# Patient Record
Sex: Female | Born: 1980 | Race: White | Hispanic: No | Marital: Married | Smoking: Never smoker
Health system: Southern US, Community
[De-identification: ages and names within clinical notes are randomized; demographics above are authoritative.]

---
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
## 2004-05-15 ENCOUNTER — Observation Stay: Payer: Self-pay | Admitting: Obstetrics and Gynecology

## 2004-05-27 ENCOUNTER — Inpatient Hospital Stay: Payer: Self-pay

## 2005-07-23 HISTORY — PX: BACK SURGERY: SHX140

## 2006-10-21 ENCOUNTER — Observation Stay: Payer: Self-pay | Admitting: Obstetrics and Gynecology

## 2006-10-27 ENCOUNTER — Inpatient Hospital Stay: Payer: Self-pay

## 2007-06-17 ENCOUNTER — Ambulatory Visit: Payer: Self-pay

## 2008-07-12 ENCOUNTER — Ambulatory Visit: Payer: Self-pay

## 2008-07-23 HISTORY — PX: GALLBLADDER SURGERY: SHX652

## 2008-12-03 ENCOUNTER — Inpatient Hospital Stay: Payer: Self-pay | Admitting: Certified Nurse Midwife

## 2010-04-26 IMAGING — US ABDOMEN ULTRASOUND
1 series · 17 of 25 positions shown · non-contrast
Comparison: none

REASON FOR EXAM: 16 weeks OB pt   epigastric pain   CALL report   [id number]
COMMENTS:

[Series 1: abdomen ultrasound · 17 of 49 slices shown]
[im 1/49]
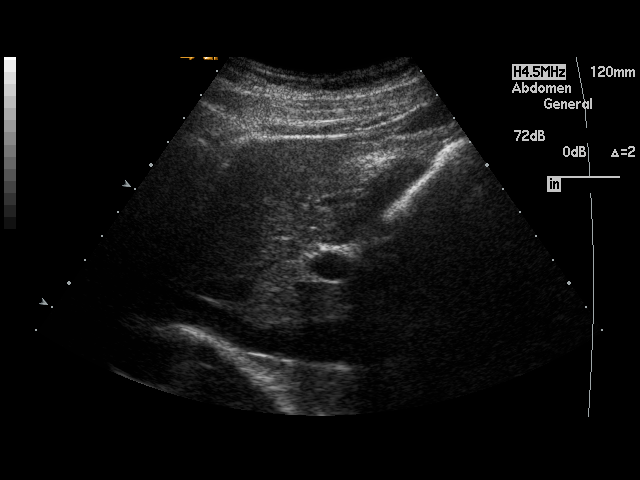
[im 5/49]
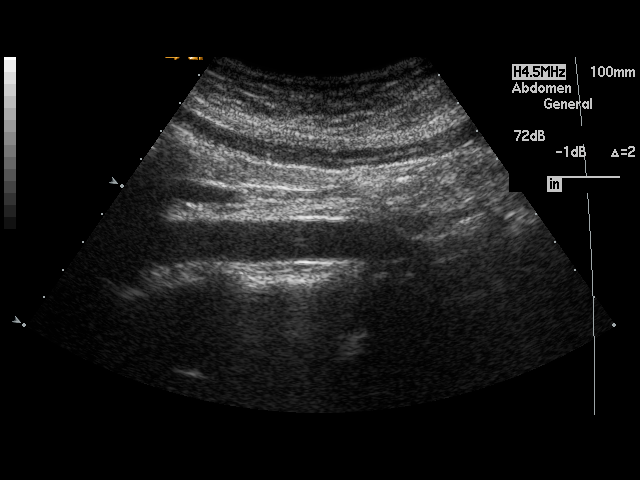
[im 7/49]
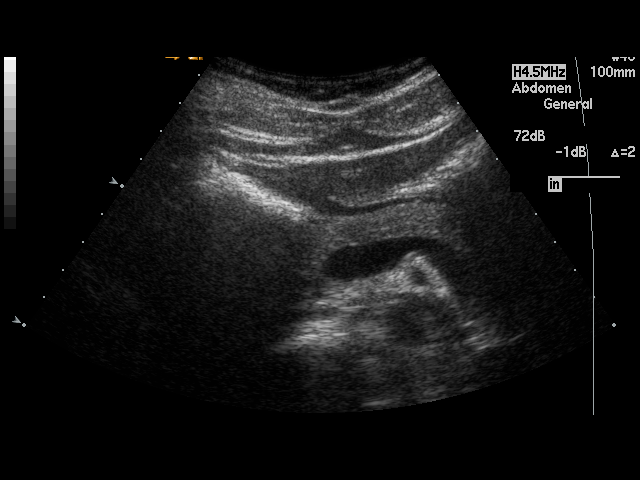
[im 11/49]
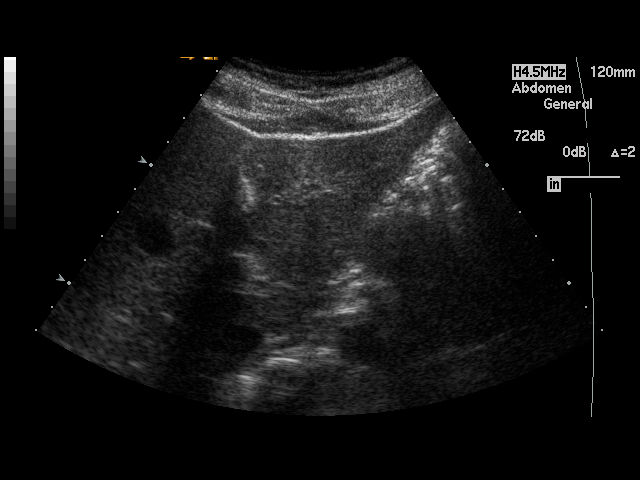
[im 13/49]
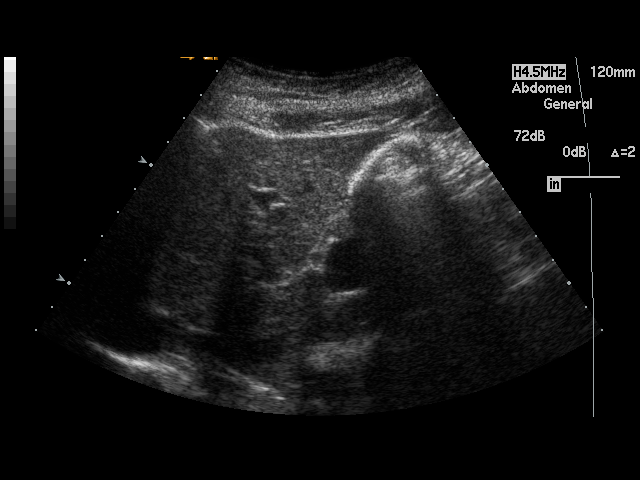
[im 17/49]
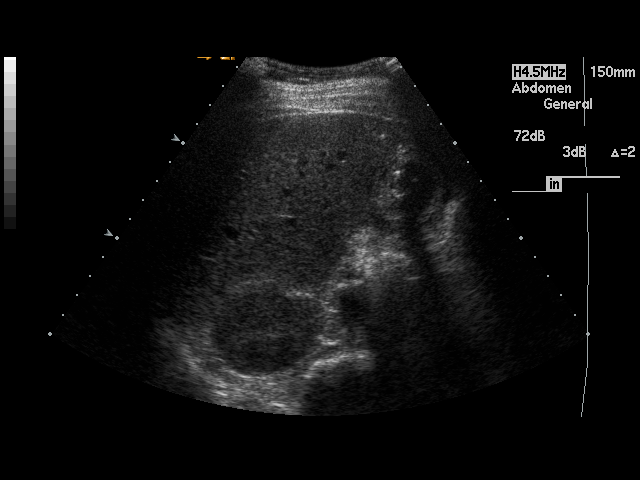
[im 19/49]
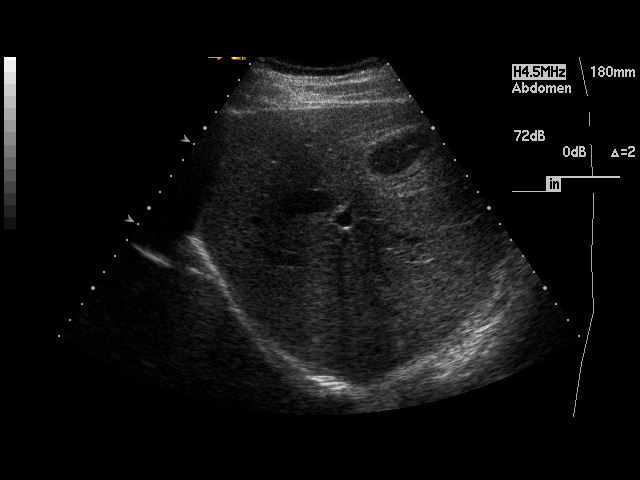
[im 23/49]
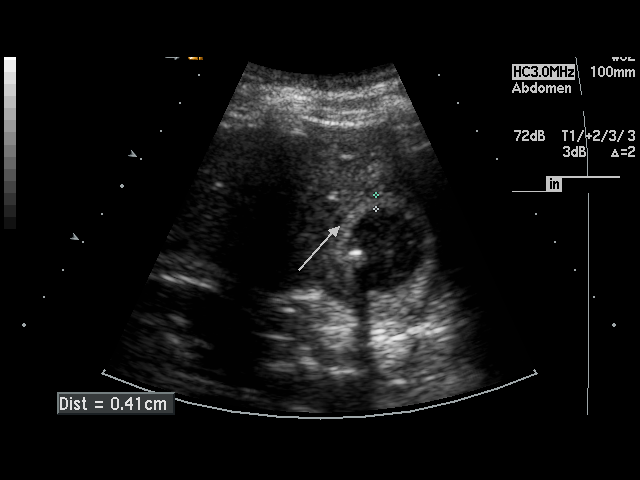
[im 25/49]
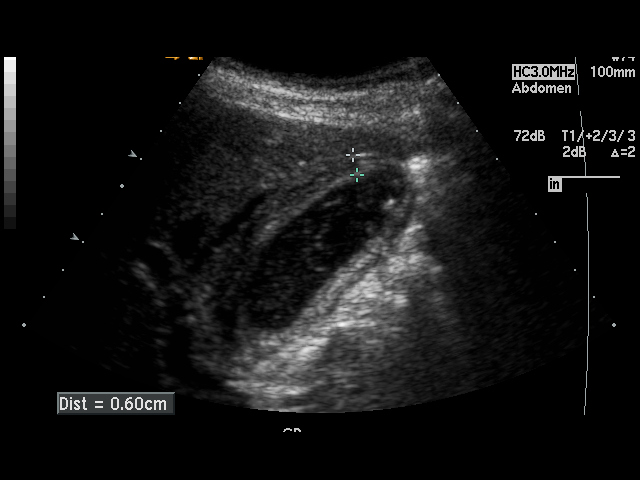
[im 27/49]
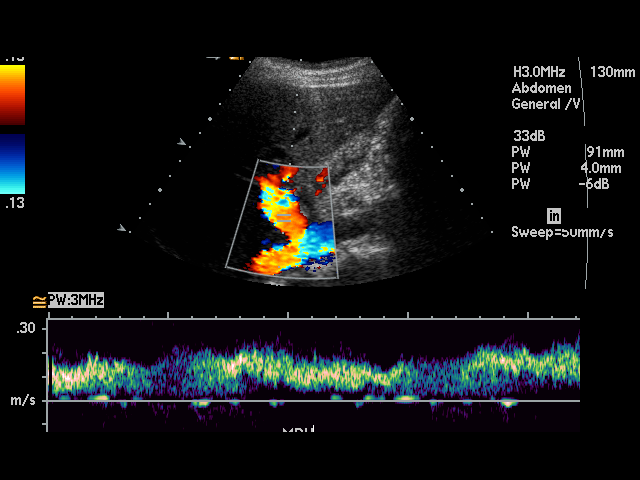
[im 31/49]
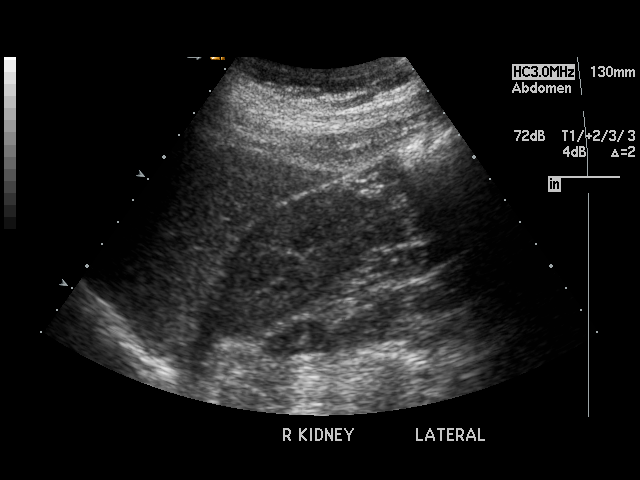
[im 33/49]
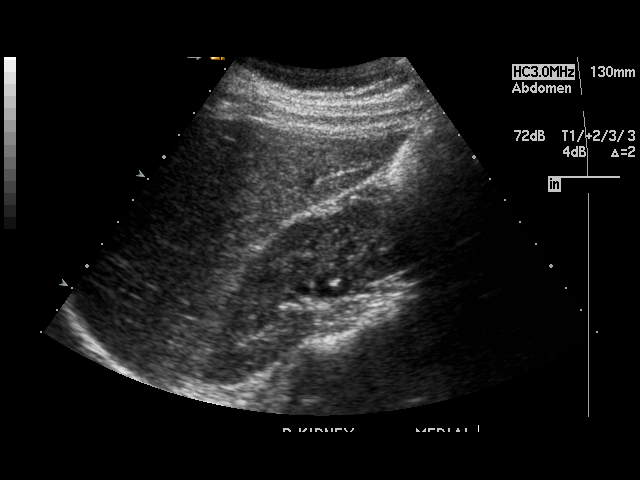
[im 37/49]
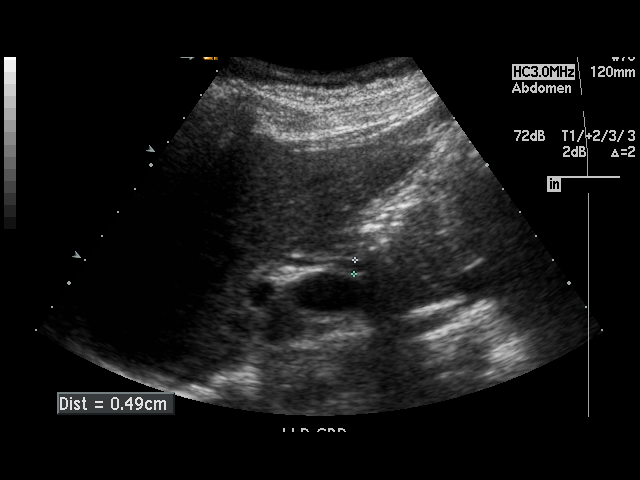
[im 39/49]
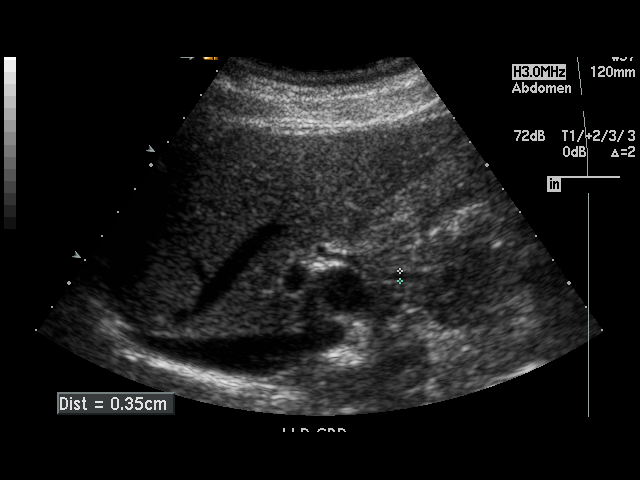
[im 43/49]
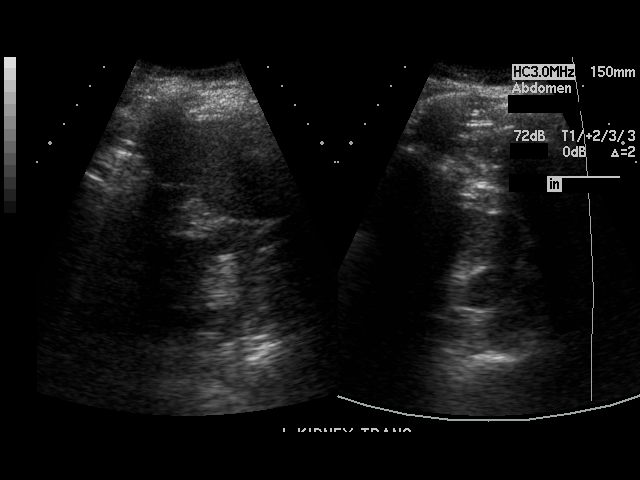
[im 45/49]
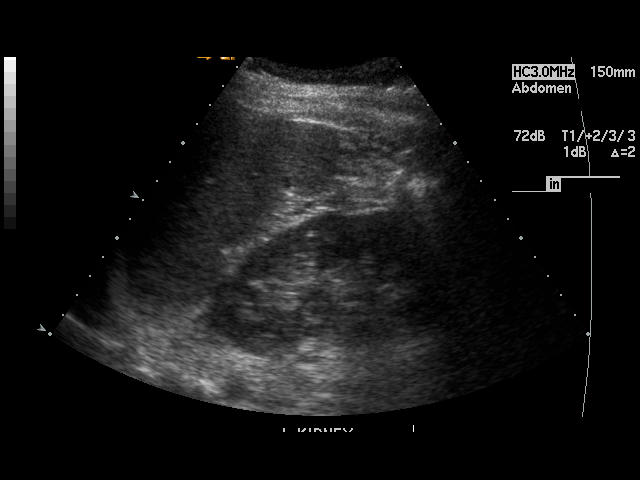
[im 49/49]
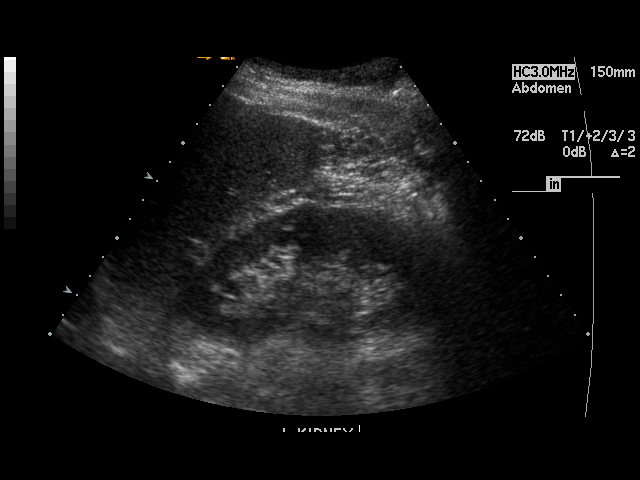

[17 of 25 positions shown; findings below may reference images not displayed]

PROCEDURE:     US  - US ABDOMEN GENERAL SURVEY  - July 12, 2008  [DATE]

RESULT:     The liver, spleen, pancreas, abdominal aorta and inferior vena
cava show no significant abnormalities. There are noted mobile
echogenicities in the gallbladder compatible with gallstones. There is also
some sludge in the gallbladder. There is thickening of the gallbladder wall
which measures up to 6 mm in thickness. A small amount of pericholecystic
fluid is seen. The general appearance is consistent with cholecystitis. The
common bile duct measures 4 mm in diameter which is within normal limits.
The kidneys show no hydronephrosis. There is no ascites.
IMPRESSION: 1. Cholelithiasis.
2. There is thickening of the gallbladder wall and a small amount of
pericholecystic fluid, suspicious for cholecystitis.

## 2010-06-15 ENCOUNTER — Emergency Department: Payer: Self-pay | Admitting: Emergency Medicine

## 2010-06-16 ENCOUNTER — Ambulatory Visit: Payer: Self-pay | Admitting: Surgery

## 2010-06-20 LAB — PATHOLOGY REPORT

## 2014-11-19 ENCOUNTER — Other Ambulatory Visit: Payer: Self-pay | Admitting: Obstetrics and Gynecology

## 2014-11-19 DIAGNOSIS — Z1231 Encounter for screening mammogram for malignant neoplasm of breast: Secondary | ICD-10-CM

## 2014-12-07 ENCOUNTER — Ambulatory Visit
Admission: RE | Admit: 2014-12-07 | Discharge: 2014-12-07 | Disposition: A | Discharge status: Home / Self Care | Payer: BC Managed Care – PPO | Source: Ambulatory Visit | Attending: Obstetrics and Gynecology | Admitting: Obstetrics and Gynecology

## 2014-12-07 DIAGNOSIS — Z1231 Encounter for screening mammogram for malignant neoplasm of breast: Secondary | ICD-10-CM | POA: Diagnosis not present

## 2015-11-01 ENCOUNTER — Ambulatory Visit (INDEPENDENT_AMBULATORY_CARE_PROVIDER_SITE_OTHER): Payer: BC Managed Care – PPO | Admitting: Podiatry

## 2015-11-01 ENCOUNTER — Encounter: Payer: Self-pay | Admitting: Podiatry

## 2015-11-01 DIAGNOSIS — L600 Ingrowing nail: Secondary | ICD-10-CM | POA: Insufficient documentation

## 2015-11-01 DIAGNOSIS — L603 Nail dystrophy: Secondary | ICD-10-CM | POA: Diagnosis not present

## 2015-11-01 NOTE — Progress Notes (Signed)
   Subjective:    Patient ID: Andrea Murray, female    DOB: [birth year], 35 y.o.   MRN: [id number]  HPI  35 year old female presents the office they for concerns of a possible ingrown tendon of the left big toe. She states that both of her nails have fallen off in the last 6 months as she does multiple [employer] races and hiking. She currently denies any pain to the toe but at times excellent toenail becomes sore the end of the day when wearing regular shoes. She denies any drainage or redness or any swelling or pus. She said no previous treatment. No other complaints at this time.   Review of Systems  All other systems reviewed and are negative.      Objective:   Physical Exam General: AAO x3, NAD  Dermatological: Left hallux nail. 3 hypertrophic, dystrophic, discolored. There is incurvation of both the medial [place] boarders Herbert SetaHeather is no tenderness at this time. There is no edema, erythema, drainage or pus. No signs of infection. The right hallux toenail. She hypertrophic and dystrophic is well with a slightly red discoloration which is been present apparently since then he nails grow back in. Nails are also thick. There is no pain of the toenail is no swelling erythema or drainage. There is no tenderness the remaining toenails.  Vascular: Dorsalis Pedis artery and Posterior Tibial artery pedal pulses are 2/4 bilateral with immedate capillary fill time. Pedal hair growth present. No varicosities and no lower extremity edema present bilateral. There is no pain with calf compression, swelling, warmth, erythema.   Neruologic: Grossly intact via light touch bilateral. Vibratory intact via tuning fork bilateral. Protective threshold with Semmes Wienstein monofilament intact to all pedal sites bilateral. Patellar and Achilles deep tendon reflexes 2+ bilateral. No Babinski or clonus noted bilateral.   Musculoskeletal: No gross boney pedal deformities bilateral. No pain, crepitus, or limitation  noted with foot and ankle range of motion bilateral. Muscular strength 5/5 in all groups tested bilateral.  Gait: Unassisted, Nonantalgic.      Assessment & Plan:  35 year old female ingrown toenail left hallux and onychodystrophy bilaterally -Treatment options discussed including all alternatives, risks, and complications -Etiology of symptoms were discussed -I discussed partial nail avulsion the left hallux however she is coming hiking in the next couple of days for the weekend there is no signs of infection are currently paints she will hold off on that for now. Continue to monitor. Due to discoloration on the right side the nail was debrided and sent to pathology/culture. [facility]  -Discussed nail hygiene improper trimming techniques. -Follow-up after nail culture.  Ovid CurdMatthew Wagoner, DPM

## 2015-11-01 NOTE — Addendum Note (Signed)
Addended by: Hadley PenOX, Kaiyana Bedore R on: 11/01/2015 03:40 PM   Modules accepted: Orders

## 2015-11-29 ENCOUNTER — Telehealth: Payer: Self-pay | Admitting: *Deleted

## 2015-11-29 NOTE — Telephone Encounter (Signed)
Spoke with patient today and stated that the nail culture was negative and that the patient could try over the counter urea which could be gotten at the [place] office or at any pharmacy and patient understood this and I stated to call the office if any concerns or questions. Andrea Murray

## 2018-05-01 ENCOUNTER — Encounter (INDEPENDENT_AMBULATORY_CARE_PROVIDER_SITE_OTHER): Payer: Self-pay

## 2018-05-01 ENCOUNTER — Ambulatory Visit: Payer: BC Managed Care – PPO | Admitting: Primary Care

## 2018-05-01 ENCOUNTER — Encounter: Payer: Self-pay | Admitting: Primary Care

## 2018-05-01 VITALS — BP 120/76 | HR 87 | Temp 98.0°F | Ht 67.5 in | Wt 199.5 lb

## 2018-05-01 DIAGNOSIS — Z0000 Encounter for general adult medical examination without abnormal findings: Secondary | ICD-10-CM | POA: Diagnosis not present

## 2018-05-01 LAB — COMPREHENSIVE METABOLIC PANEL
ALK PHOS: 57 U/L (ref 39–117)
ALT: 36 U/L — ABNORMAL HIGH (ref 0–35)
AST: 32 U/L (ref 0–37)
Albumin: 4 g/dL (ref 3.5–5.2)
BUN: 9 mg/dL (ref 6–23)
CHLORIDE: 103 meq/L (ref 96–112)
CO2: 27 meq/L (ref 19–32)
Calcium: 9.4 mg/dL (ref 8.4–10.5)
Creatinine, Ser: 0.8 mg/dL (ref 0.40–1.20)
GFR: 85.11 mL/min (ref >60.00)
GLUCOSE: 99 mg/dL (ref 70–99)
POTASSIUM: 3.9 meq/L (ref 3.5–5.1)
SODIUM: 136 meq/L (ref 135–145)
Total Bilirubin: 0.5 mg/dL (ref 0.2–1.2)
Total Protein: 7.8 g/dL (ref 6.0–8.3)

## 2018-05-01 LAB — LIPID PANEL
CHOLESTEROL: 200 mg/dL (ref 0–200)
HDL: 60.2 mg/dL (ref >39.00)
NonHDL: 140.1
Total CHOL/HDL Ratio: 3
Triglycerides: 212 mg/dL — ABNORMAL HIGH (ref 0.0–149.0)
VLDL: 42.4 mg/dL — AB (ref 0.0–40.0)

## 2018-05-01 LAB — LDL CHOLESTEROL, DIRECT: Direct LDL: 122 mg/dL

## 2018-05-01 NOTE — Patient Instructions (Signed)
Stop by the lab prior to leaving today. I will notify you of your results once received.   Continue exercising. You should be getting 150 minutes of moderate intensity exercise weekly.  Continue to work on your diet by limiting fast food, fried food, junk food. Increase vegetables, fruit, whole grains, lean protein.  Ensure you are consuming 64 ounces of water daily.  We will see you in one year for your annual exam or sooner if needed.  It was a pleasure to see you today!   Preventive Care 18-39 Years, Female Preventive care refers to lifestyle choices and visits with your health care provider that can promote health and wellness. What does preventive care include?  A yearly physical exam. This is also called an annual well check.  Dental exams once or twice a year.  Routine eye exams. Ask your health care provider how often you should have your eyes checked.  Personal lifestyle choices, including: ? Daily care of your teeth and gums. ? Regular physical activity. ? Eating a healthy diet. ? Avoiding tobacco and drug use. ? Limiting alcohol use. ? Practicing safe sex. ? Taking vitamin and mineral supplements as recommended by your health care provider. What happens during an annual well check? The services and screenings done by your health care provider during your annual well check will depend on your age, overall health, lifestyle risk factors, and family history of disease. Counseling Your health care provider may ask you questions about your:  Alcohol use.  Tobacco use.  Drug use.  Emotional well-being.  Home and relationship well-being.  Sexual activity.  Eating habits.  Work and work Statistician.  Method of birth control.  Menstrual cycle.  Pregnancy history.  Screening You may have the following tests or measurements:  Height, weight, and BMI.  Diabetes screening. This is done by checking your blood sugar (glucose) after you have not eaten for a  while (fasting).  Blood pressure.  Lipid and cholesterol levels. These may be checked every 5 years starting at age 13.  Skin check.  Hepatitis C blood test.  Hepatitis B blood test.  Sexually transmitted disease (STD) testing.  BRCA-related cancer screening. This may be done if you have a family history of breast, ovarian, tubal, or peritoneal cancers.  Pelvic exam and Pap test. This may be done every 3 years starting at age 66. Starting at age 24, this may be done every 5 years if you have a Pap test in combination with an HPV test.  Discuss your test results, treatment options, and if necessary, the need for more tests with your health care provider. Vaccines Your health care provider may recommend certain vaccines, such as:  Influenza vaccine. This is recommended every year.  Tetanus, diphtheria, and acellular pertussis (Tdap, Td) vaccine. You may need a Td booster every 10 years.  Varicella vaccine. You may need this if you have not been vaccinated.  HPV vaccine. If you are 53 or younger, you may need three doses over 6 months.  Measles, mumps, and rubella (MMR) vaccine. You may need at least one dose of MMR. You may also need a second dose.  Pneumococcal 13-valent conjugate (PCV13) vaccine. You may need this if you have certain conditions and were not previously vaccinated.  Pneumococcal polysaccharide (PPSV23) vaccine. You may need one or two doses if you smoke cigarettes or if you have certain conditions.  Meningococcal vaccine. One dose is recommended if you are age 36-21 years and a Market researcher  living in a residence hall, or if you have one of several medical conditions. You may also need additional booster doses.  Hepatitis A vaccine. You may need this if you have certain conditions or if you travel or work in places where you may be exposed to hepatitis A.  Hepatitis B vaccine. You may need this if you have certain conditions or if you travel or work  in places where you may be exposed to hepatitis B.  Haemophilus influenzae type b (Hib) vaccine. You may need this if you have certain risk factors.  Talk to your health care provider about which screenings and vaccines you need and how often you need them. This information is not intended to replace advice given to you by your health care provider. Make sure you discuss any questions you have with your health care provider. Document Released: 09/04/2001 Document Revised: 03/28/2016 Document Reviewed: 05/10/2015 Elsevier Interactive Patient Education  [employer].

## 2018-05-01 NOTE — Assessment & Plan Note (Signed)
Td UTD, declines influenza vaccination. Pap smear UTD, follows with GYN. Recommended regular exercise, healthy diet. Exam unremarkable. Labs pending. Follow up in 1 year for CPE.

## 2018-05-01 NOTE — Progress Notes (Signed)
Subjective:    Patient ID: Andrea Murray, female    DOB: 1981-03-14, 37 y.o.   MRN: [id number]  HPI  Andrea Murray is a 37 year old female who presents today to establish care and for complete physical.    Immunizations: -Tetanus: Completed in 2012 -Influenza: Declines   Diet: She endorses a healthy diet Breakfast: Bagel, granola bar Lunch: Salad, sandwiches Dinner: Fast food, take out, [employer] Snacks: Crackers, cheese, chips Desserts: None Beverages: Water, beer, wine  Exercise: She recently started an exercise regimen 10 days ago.  Eye exam: Completed in 2019 Dental exam: Completes semi-annually  Pap Smear: Following with GYN   Review of Systems  Constitutional: Negative for unexpected weight change.  HENT: Negative for rhinorrhea.   Respiratory: Negative for cough and shortness of breath.   Cardiovascular: Negative for chest pain.  Gastrointestinal: Negative for constipation and diarrhea.  Genitourinary: Negative for difficulty urinating and menstrual problem.  Musculoskeletal: Negative for myalgias.       Intermittent back pain, lower. Intermittent sciatica   Skin: Negative for rash.  Allergic/Immunologic: Negative for environmental allergies.  Neurological: Negative for dizziness and headaches.  Psychiatric/Behavioral: The patient is not nervous/anxious.        History reviewed. No pertinent past medical history.   Social History   Socioeconomic History  . Marital status: Married    Spouse name: Not on file  . Number of children: Not on file  . Years of education: Not on file  . Highest education level: Not on file  Occupational History  . Not on file  Social Needs  . Financial resource strain: Not on file  . Food insecurity:    Worry: Not on file    Inability: Not on file  . Transportation needs:    Medical: Not on file    Non-medical: Not on file  Tobacco Use  . Smoking status: Never Smoker  . Smokeless tobacco: Never Used  Substance  and Sexual Activity  . Alcohol use: No    Alcohol/week: 0.0 standard drinks  . Drug use: No  . Sexual activity: Not on file  Lifestyle  . Physical activity:    Days per week: Not on file    Minutes per session: Not on file  . Stress: Not on file  Relationships  . Social connections:    Talks on phone: Not on file    Gets together: Not on file    Attends religious service: Not on file    Active member of club or organization: Not on file    Attends meetings of clubs or organizations: Not on file    Relationship status: Not on file  . Intimate partner violence:    Fear of current or ex partner: Not on file    Emotionally abused: Not on file    Physically abused: Not on file    Forced sexual activity: Not on file  Other Topics Concern  . Not on file  Social History Narrative  . Not on file    Past Surgical History:  Procedure Laterality Date  . BACK SURGERY  2007  . GALLBLADDER SURGERY  2010  . TONSILLECTOMY  1993    Family History  Problem Relation Age of Onset  . Alcohol abuse Mother   . Alcohol abuse Father   . Alcohol abuse Sister   . Depression Sister   . Mental illness Sister     No Known Allergies  Current Outpatient Medications on File Prior to Visit  Medication Sig Dispense Refill  . Norgestimate-Ethinyl Estradiol Triphasic 0.18/0.215/0.25 MG-35 MCG tablet Take 1 tablet by mouth daily.      No current facility-administered medications on file prior to visit.     BP 120/76   Pulse 87   Temp 98 F (36.7 C) (Oral)   Ht 5' 7.5" (1.715 m)   Wt 199 lb 8 oz (90.5 kg)   LMP 04/22/2018   SpO2 98%   BMI 30.78 kg/m    Objective:   Physical Exam  Constitutional: She is oriented to person, place, and time. She appears well-nourished.  HENT:  Mouth/Throat: No oropharyngeal exudate.  Eyes: Pupils are equal, round, and reactive to light. EOM are normal.  Neck: Neck supple. No thyromegaly present.  Cardiovascular: Normal rate and regular rhythm.    Respiratory: Effort normal and breath sounds normal.  GI: Soft. Bowel sounds are normal. There is no tenderness.  Musculoskeletal: Normal range of motion.  Neurological: She is alert and oriented to person, place, and time.  Skin: Skin is warm and dry.  Psychiatric: She has a normal mood and affect.           Assessment & Plan:

## 2018-05-05 ENCOUNTER — Encounter: Payer: Self-pay | Admitting: *Deleted

## 2020-07-01 ENCOUNTER — Other Ambulatory Visit: Payer: Self-pay | Admitting: Obstetrics and Gynecology

## 2020-07-01 DIAGNOSIS — Z1231 Encounter for screening mammogram for malignant neoplasm of breast: Secondary | ICD-10-CM

## 2020-07-19 ENCOUNTER — Ambulatory Visit
Admission: RE | Admit: 2020-07-19 | Discharge: 2020-07-19 | Disposition: A | Discharge status: Home / Self Care | Payer: BC Managed Care – PPO | Source: Ambulatory Visit | Attending: Obstetrics and Gynecology | Admitting: Obstetrics and Gynecology

## 2020-07-19 ENCOUNTER — Other Ambulatory Visit: Payer: Self-pay

## 2020-07-19 DIAGNOSIS — Z1231 Encounter for screening mammogram for malignant neoplasm of breast: Secondary | ICD-10-CM | POA: Insufficient documentation

## 2022-05-03 IMAGING — MG DIGITAL SCREENING BILAT W/ TOMO W/ CAD
8 series · 8 of 24 positions shown · non-contrast
Comparison: Previous exam(s).

CLINICAL DATA: Screening.

EXAM:
DIGITAL SCREENING BILATERAL MAMMOGRAM WITH TOMO AND CAD

[L MLO synth-2D]
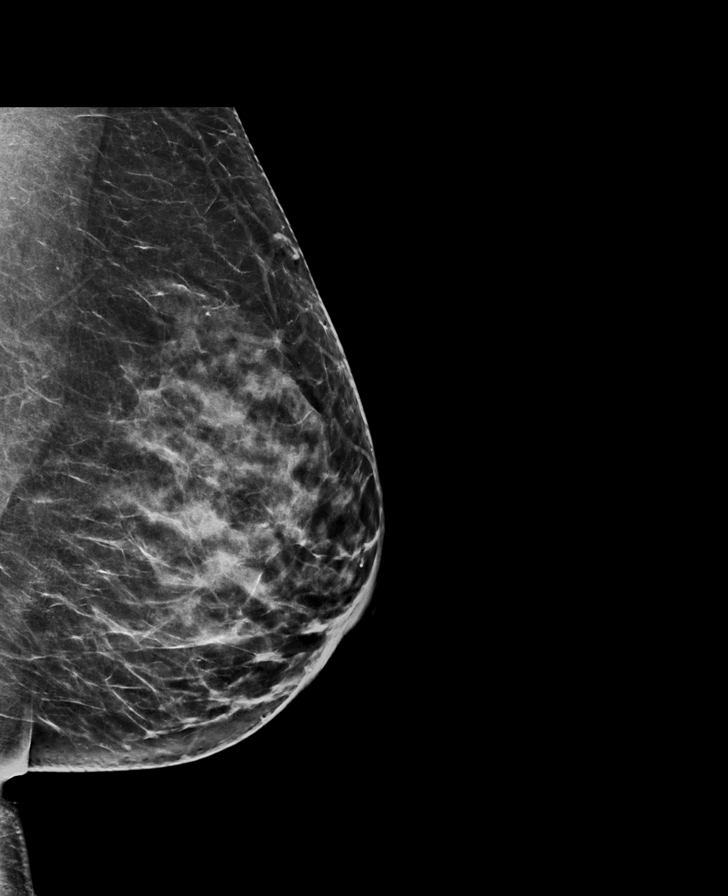

[L CC synth-2D]
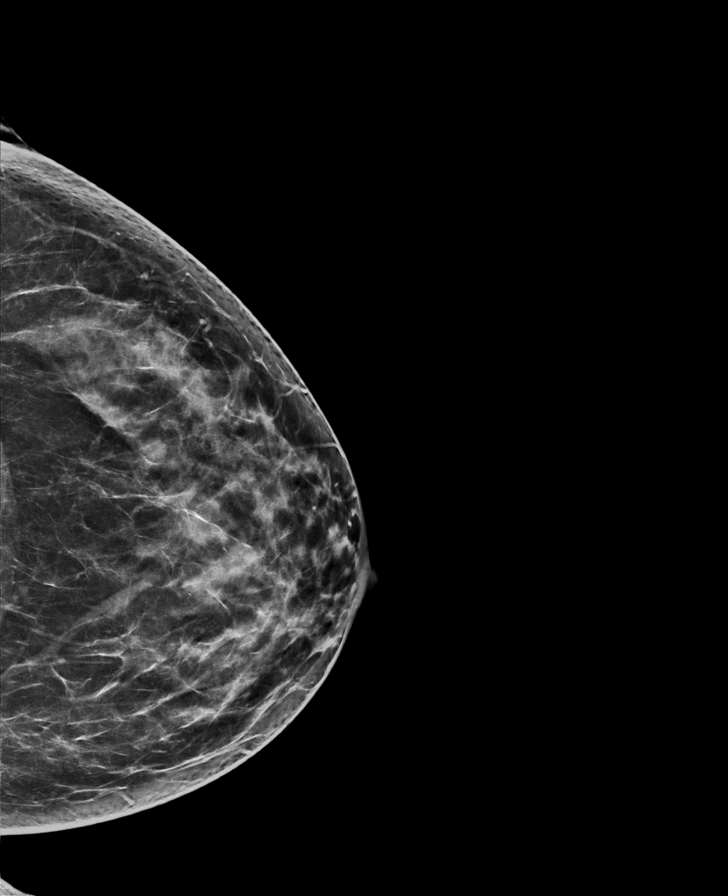

[R MLO synth-2D]
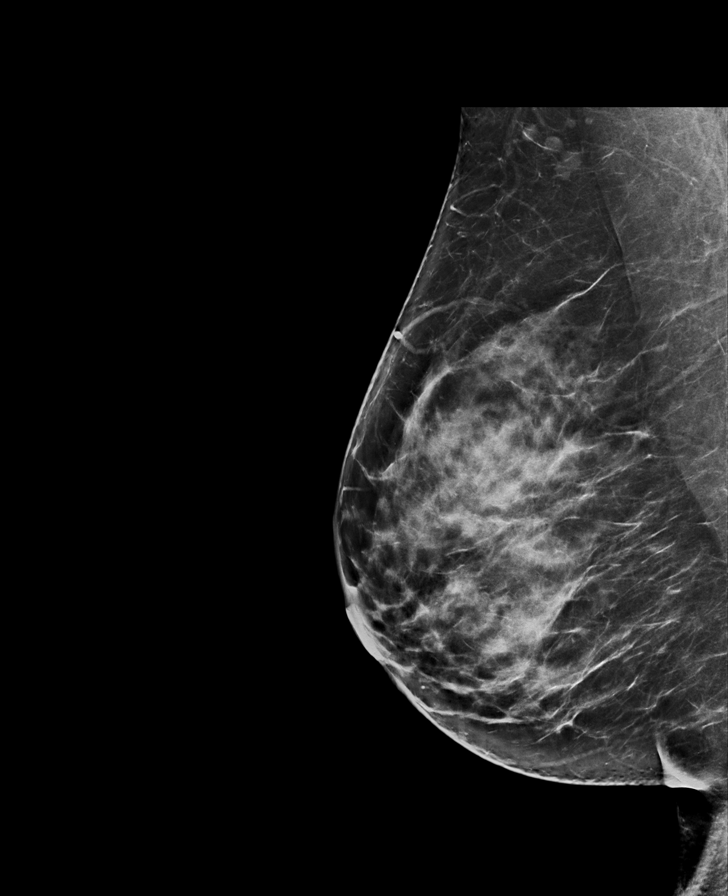

[R CC synth-2D]
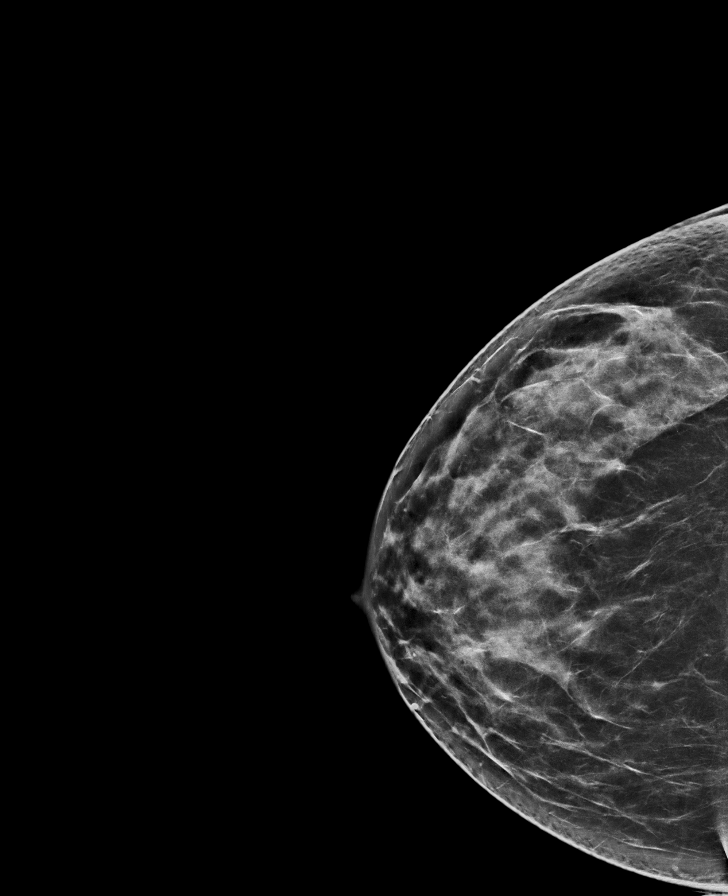

[R MLO tomo · tomo slice 43/86.0]
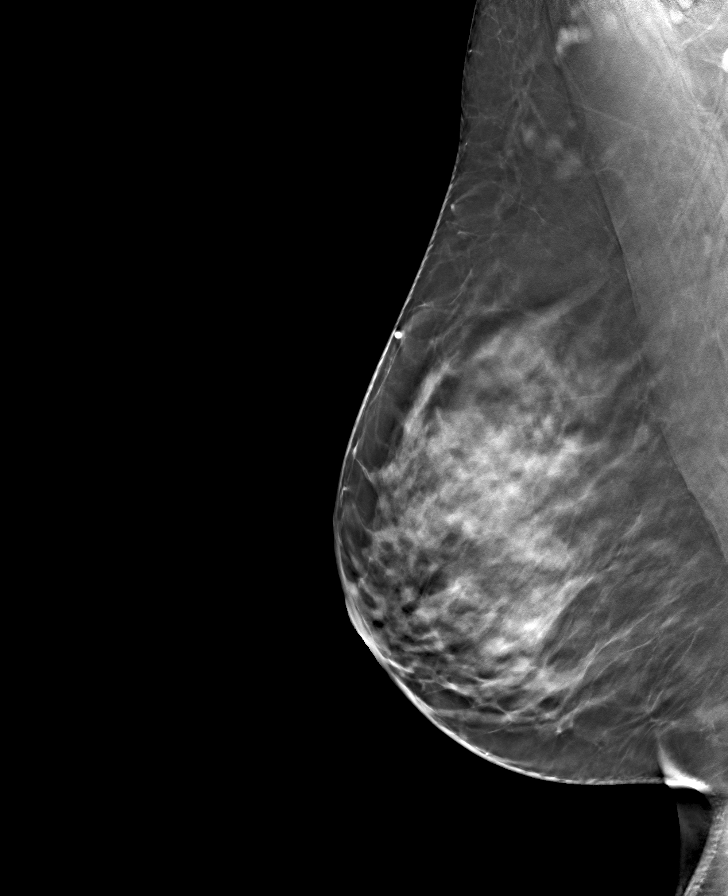

[R CC tomo · tomo slice 40/79.0]
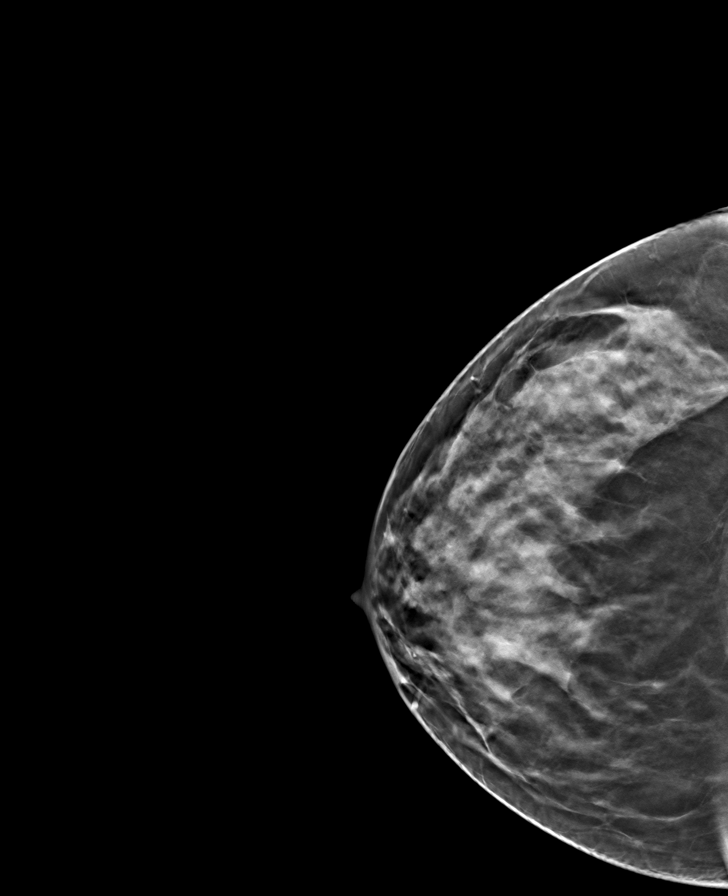

[L MLO tomo · tomo slice 41/81.0]
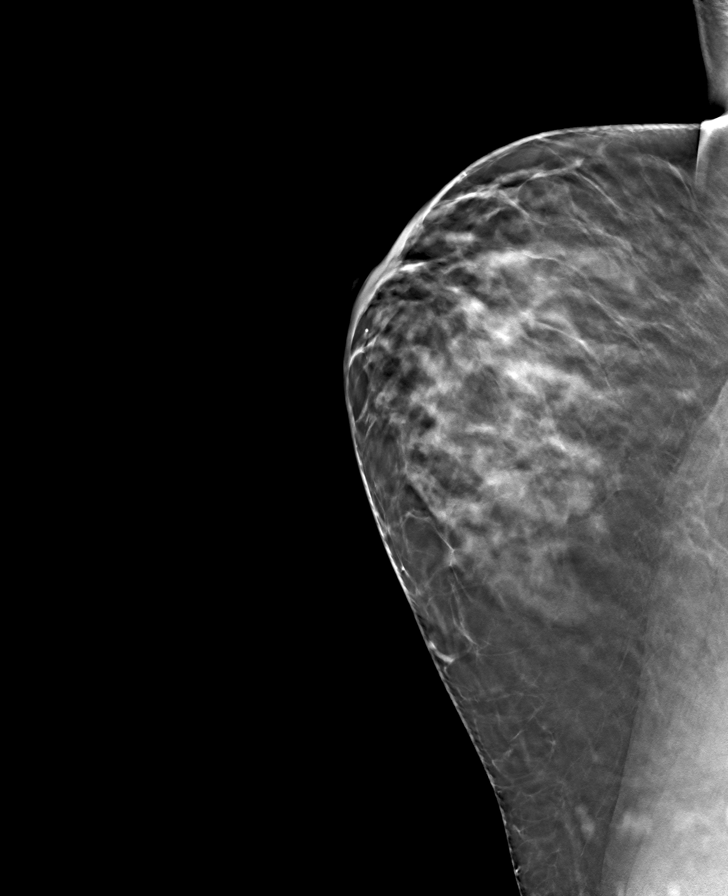

[L CC tomo · tomo slice 40/79.0]
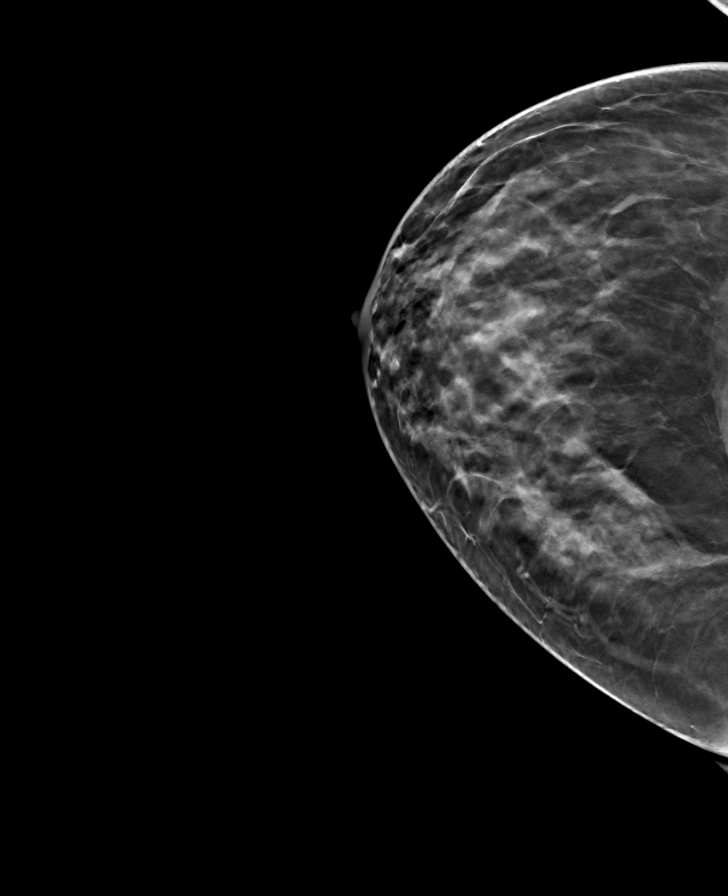

[8 of 24 positions shown; findings below may reference images not displayed]

ACR Breast Density Category c: The breast tissue is heterogeneously
dense, which may obscure small masses.
FINDINGS: There are no findings suspicious for malignancy. Images were
processed with CAD.
IMPRESSION: No mammographic evidence of malignancy. A result letter of this
screening mammogram will be mailed directly to the patient.

RECOMMENDATION:
Screening mammogram in one year. (Code:FT-U-LHB)

BI-RADS CATEGORY  1: Negative.

## 2022-09-20 ENCOUNTER — Other Ambulatory Visit: Payer: Self-pay | Admitting: Obstetrics and Gynecology

## 2022-09-20 DIAGNOSIS — Z1231 Encounter for screening mammogram for malignant neoplasm of breast: Secondary | ICD-10-CM

## 2023-04-25 ENCOUNTER — Other Ambulatory Visit: Payer: Self-pay | Admitting: Family Medicine

## 2023-04-25 DIAGNOSIS — Z1231 Encounter for screening mammogram for malignant neoplasm of breast: Secondary | ICD-10-CM

## 2023-07-22 ENCOUNTER — Ambulatory Visit
Admission: RE | Admit: 2023-07-22 | Discharge: 2023-07-22 | Disposition: A | Discharge status: Home / Self Care | Payer: BC Managed Care – PPO | Source: Ambulatory Visit | Attending: Family Medicine | Admitting: Family Medicine

## 2023-07-22 DIAGNOSIS — Z1231 Encounter for screening mammogram for malignant neoplasm of breast: Secondary | ICD-10-CM | POA: Insufficient documentation
